# Patient Record
Sex: Female | Born: 1965
Health system: Southern US, Community
[De-identification: ages and names within clinical notes are randomized; demographics above are authoritative.]

## PROBLEM LIST (undated history)

## (undated) HISTORY — PX: ABDOMINAL HYSTERECTOMY: SHX81

## (undated) HISTORY — PX: CHOLECYSTECTOMY: SHX55

## (undated) HISTORY — PX: TONSILLECTOMY: SUR1361

---

## 1998-09-22 ENCOUNTER — Other Ambulatory Visit: Admission: RE | Admit: 1998-09-22 | Discharge: 1998-09-22 | Payer: Self-pay | Admitting: Obstetrics and Gynecology

## 1998-11-28 ENCOUNTER — Encounter: Payer: Self-pay | Admitting: Obstetrics and Gynecology

## 1998-11-28 ENCOUNTER — Ambulatory Visit (HOSPITAL_COMMUNITY): Admission: RE | Admit: 1998-11-28 | Discharge: 1998-11-28 | Payer: Self-pay | Admitting: Obstetrics and Gynecology

## 1998-12-24 ENCOUNTER — Encounter: Payer: Self-pay | Admitting: Obstetrics and Gynecology

## 1998-12-24 ENCOUNTER — Ambulatory Visit (HOSPITAL_COMMUNITY): Admission: RE | Admit: 1998-12-24 | Discharge: 1998-12-24 | Payer: Self-pay | Admitting: Obstetrics and Gynecology

## 1999-02-09 ENCOUNTER — Ambulatory Visit (HOSPITAL_COMMUNITY): Admission: RE | Admit: 1999-02-09 | Discharge: 1999-02-09 | Payer: Self-pay | Admitting: Obstetrics and Gynecology

## 2000-07-21 ENCOUNTER — Other Ambulatory Visit: Admission: RE | Admit: 2000-07-21 | Discharge: 2000-07-21 | Payer: Self-pay | Admitting: Dermatology

## 2002-08-16 ENCOUNTER — Other Ambulatory Visit: Admission: RE | Admit: 2002-08-16 | Discharge: 2002-08-16 | Payer: Self-pay | Admitting: Obstetrics and Gynecology

## 2003-11-01 ENCOUNTER — Ambulatory Visit (HOSPITAL_COMMUNITY): Admission: RE | Admit: 2003-11-01 | Discharge: 2003-11-01 | Payer: Self-pay | Admitting: Family Medicine

## 2003-11-04 ENCOUNTER — Ambulatory Visit (HOSPITAL_COMMUNITY): Admission: RE | Admit: 2003-11-04 | Discharge: 2003-11-04 | Payer: Self-pay | Admitting: Family Medicine

## 2003-12-03 ENCOUNTER — Ambulatory Visit (HOSPITAL_COMMUNITY): Admission: RE | Admit: 2003-12-03 | Discharge: 2003-12-03 | Payer: Self-pay | Admitting: Family Medicine

## 2004-01-06 ENCOUNTER — Ambulatory Visit (HOSPITAL_COMMUNITY): Admission: RE | Admit: 2004-01-06 | Discharge: 2004-01-06 | Payer: Self-pay | Admitting: *Deleted

## 2004-02-04 ENCOUNTER — Ambulatory Visit (HOSPITAL_COMMUNITY): Admission: RE | Admit: 2004-02-04 | Discharge: 2004-02-04 | Payer: Self-pay | Admitting: Family Medicine

## 2009-01-13 ENCOUNTER — Encounter (INDEPENDENT_AMBULATORY_CARE_PROVIDER_SITE_OTHER): Payer: Self-pay | Admitting: Obstetrics and Gynecology

## 2009-01-13 ENCOUNTER — Ambulatory Visit (HOSPITAL_COMMUNITY): Admission: RE | Admit: 2009-01-13 | Discharge: 2009-01-14 | Payer: Self-pay | Admitting: Obstetrics and Gynecology

## 2009-08-13 ENCOUNTER — Encounter: Admission: RE | Admit: 2009-08-13 | Discharge: 2009-08-13 | Payer: Self-pay | Admitting: Obstetrics and Gynecology

## 2010-05-05 LAB — CBC
HCT: 21.6 % — ABNORMAL LOW (ref 36.0–46.0)
HCT: 28 % — ABNORMAL LOW (ref 36.0–46.0)
Hemoglobin: 6.8 g/dL — CL (ref 12.0–15.0)
Hemoglobin: 8.6 g/dL — ABNORMAL LOW (ref 12.0–15.0)
MCHC: 30.8 g/dL (ref 30.0–36.0)
MCHC: 31.4 g/dL (ref 30.0–36.0)
MCV: 72.4 fL — ABNORMAL LOW (ref 78.0–100.0)
MCV: 72.9 fL — ABNORMAL LOW (ref 78.0–100.0)
Platelets: 308 K/uL (ref 150–400)
Platelets: 449 K/uL — ABNORMAL HIGH (ref 150–400)
RBC: 2.97 MIL/uL — ABNORMAL LOW (ref 3.87–5.11)
RBC: 3.86 MIL/uL — ABNORMAL LOW (ref 3.87–5.11)
RDW: 17.5 % — ABNORMAL HIGH (ref 11.5–15.5)
RDW: 17.7 % — ABNORMAL HIGH (ref 11.5–15.5)
WBC: 8.9 K/uL (ref 4.0–10.5)
WBC: 9.2 K/uL (ref 4.0–10.5)

## 2010-05-05 LAB — TYPE AND SCREEN
ABO/RH(D): O POS
Antibody Screen: NEGATIVE

## 2010-05-05 LAB — HEMOGLOBIN AND HEMATOCRIT, BLOOD
HCT: 23 % — ABNORMAL LOW (ref 36.0–46.0)
HCT: 24.4 % — ABNORMAL LOW (ref 36.0–46.0)
Hemoglobin: 7.2 g/dL — ABNORMAL LOW (ref 12.0–15.0)
Hemoglobin: 7.6 g/dL — ABNORMAL LOW (ref 12.0–15.0)

## 2010-05-05 LAB — ABO/RH: ABO/RH(D): O POS

## 2010-06-19 NOTE — Procedures (Signed)
Angela Jacobs, Angela Jacobs               ACCOUNT NO.:  [id number]   MEDICAL RECORD NO.:  [id number]          PATIENT TYPE:  OUT   LOCATION:  RAD                           FACILITY:  [facility]   PHYSICIAN:  Dani Gobble, MD       DATE OF BIRTH:  08/02/1965   DATE OF PROCEDURE:  12/03/2003  DATE OF DISCHARGE:                                  ECHOCARDIOGRAM   INDICATIONS:  [name] is a 45 year old female with a history of  shortness of breath, status post bronchitis over the past one to two months.  She has a strong family history heart disease.   STUDY:  The technical quality of the study is adequate although apical views  are somewhat limited secondary to patient's body habitus.   FINDINGS:  Aorta is within normal limits at 2.5 cm.   The left atrium is within normal limits at 2.8 cm.  No obvious clots or  masses were appreciated.  The patient appeared to be in sinus rhythm during  this procedure.   The interventricular septum and posterior wall within normal limits with  thickness of 1.1 cm and 1 cm respectively.   The aortic valve appeared to grossly structurally normal with normal leaflet  excursion.  No significant aortic insufficiency was noted.  Doppler  interrogation of the aortic valve also was within normal limits.   The mitral valve also appeared structurally normal.  There was flat  coaptation of the mitral valve leaflets but no definite mitral valve  prolapse was appreciated.  No significant mitral regurgitation was noted.  Doppler interrogation of the mitral valve was within normal limits.   Pulmonic valve was incompletely visualized.   The tricuspid valve appeared grossly structurally normal with trivial  tricuspid regurgitation noted.   The left ventricle was normal in size and overall left ventricular systolic  function appeared to be low normal.  No regional wall motion abnormalities  were noted.  There was no evidence for diastolic dysfunction.  The right  atrium  appeared borderline dilated to mildly dilated.  The right ventricle  was normal in size with normal right ventricular systolic function.   IMPRESSION:  1.  Flat coaptation of the mitral valve leaflets without definite mitral      valve prolapse appreciated.  2.  Borderline to mildly dilated right atrium.  3.  Normal left ventricular size with low normal left ventricular systolic      function.  No regional wall motion abnormalities were appreciated.  4.  Trivial tricuspid regurgitation.      AB/MEDQ  D:  12/03/2003  T:  12/04/2003  Job:  [id number]

## 2010-08-24 ENCOUNTER — Other Ambulatory Visit: Payer: Self-pay | Admitting: Obstetrics and Gynecology

## 2010-08-24 DIAGNOSIS — Z1231 Encounter for screening mammogram for malignant neoplasm of breast: Secondary | ICD-10-CM

## 2010-08-31 ENCOUNTER — Ambulatory Visit
Admission: RE | Admit: 2010-08-31 | Discharge: 2010-08-31 | Disposition: A | Discharge status: Home / Self Care | Payer: BC Managed Care – PPO | Source: Ambulatory Visit | Attending: Obstetrics and Gynecology | Admitting: Obstetrics and Gynecology

## 2010-08-31 DIAGNOSIS — Z1231 Encounter for screening mammogram for malignant neoplasm of breast: Secondary | ICD-10-CM

## 2013-05-02 ENCOUNTER — Other Ambulatory Visit (HOSPITAL_COMMUNITY): Payer: Self-pay | Admitting: Family Medicine

## 2013-05-02 ENCOUNTER — Ambulatory Visit (HOSPITAL_COMMUNITY)
Admission: RE | Admit: 2013-05-02 | Discharge: 2013-05-02 | Disposition: A | Discharge status: Home / Self Care | Payer: BC Managed Care – PPO | Source: Ambulatory Visit | Attending: Family Medicine | Admitting: Family Medicine

## 2013-05-02 DIAGNOSIS — M25519 Pain in unspecified shoulder: Secondary | ICD-10-CM

## 2013-07-05 ENCOUNTER — Ambulatory Visit (INDEPENDENT_AMBULATORY_CARE_PROVIDER_SITE_OTHER): Payer: BC Managed Care – PPO | Admitting: Otolaryngology

## 2013-07-05 DIAGNOSIS — H6980 Other specified disorders of Eustachian tube, unspecified ear: Secondary | ICD-10-CM

## 2013-07-05 DIAGNOSIS — J310 Chronic rhinitis: Secondary | ICD-10-CM

## 2015-05-27 ENCOUNTER — Emergency Department (HOSPITAL_COMMUNITY): Payer: 59

## 2015-05-27 ENCOUNTER — Encounter (HOSPITAL_COMMUNITY): Payer: Self-pay | Admitting: Emergency Medicine

## 2015-05-27 ENCOUNTER — Emergency Department (HOSPITAL_COMMUNITY)
Admission: EM | Admit: 2015-05-27 | Discharge: 2015-05-27 | Disposition: A | Discharge status: Home / Self Care | Payer: 59 | Attending: Emergency Medicine | Admitting: Emergency Medicine

## 2015-05-27 DIAGNOSIS — R200 Anesthesia of skin: Secondary | ICD-10-CM | POA: Diagnosis not present

## 2015-05-27 DIAGNOSIS — R1084 Generalized abdominal pain: Secondary | ICD-10-CM | POA: Diagnosis not present

## 2015-05-27 DIAGNOSIS — R110 Nausea: Secondary | ICD-10-CM | POA: Diagnosis not present

## 2015-05-27 DIAGNOSIS — Z9889 Other specified postprocedural states: Secondary | ICD-10-CM | POA: Diagnosis not present

## 2015-05-27 DIAGNOSIS — K529 Noninfective gastroenteritis and colitis, unspecified: Secondary | ICD-10-CM

## 2015-05-27 LAB — CBC WITH DIFFERENTIAL/PLATELET
BASOS PCT: 0 %
Basophils Absolute: 0 10*3/uL (ref 0.0–0.1)
EOS ABS: 0 10*3/uL (ref 0.0–0.7)
Eosinophils Relative: 0 %
HCT: 46.9 % — ABNORMAL HIGH (ref 36.0–46.0)
Hemoglobin: 15.8 g/dL — ABNORMAL HIGH (ref 12.0–15.0)
LYMPHS PCT: 11 %
Lymphs Abs: 2.1 10*3/uL (ref 0.7–4.0)
MCH: 28.8 pg (ref 26.0–34.0)
MCHC: 33.7 g/dL (ref 30.0–36.0)
MCV: 85.6 fL (ref 78.0–100.0)
MONO ABS: 0.8 10*3/uL (ref 0.1–1.0)
Monocytes Relative: 4 %
NEUTROS ABS: 15.9 10*3/uL — AB (ref 1.7–7.7)
Neutrophils Relative %: 85 %
PLATELETS: 271 10*3/uL (ref 150–400)
RBC: 5.48 MIL/uL — ABNORMAL HIGH (ref 3.87–5.11)
RDW: 13.2 % (ref 11.5–15.5)
WBC: 18.8 10*3/uL — ABNORMAL HIGH (ref 4.0–10.5)

## 2015-05-27 LAB — COMPREHENSIVE METABOLIC PANEL
ALT: 32 U/L (ref 14–54)
AST: 35 U/L (ref 15–41)
Albumin: 4.1 g/dL (ref 3.5–5.0)
Alkaline Phosphatase: 99 U/L (ref 38–126)
Anion gap: 11 (ref 5–15)
BUN: 15 mg/dL (ref 6–20)
CHLORIDE: 107 mmol/L (ref 101–111)
CO2: 22 mmol/L (ref 22–32)
Calcium: 9.2 mg/dL (ref 8.9–10.3)
Creatinine, Ser: 0.71 mg/dL (ref 0.44–1.00)
GFR calc Af Amer: >60 mL/min (ref >60)
Glucose, Bld: 121 mg/dL — ABNORMAL HIGH (ref 65–99)
POTASSIUM: 3.7 mmol/L (ref 3.5–5.1)
SODIUM: 140 mmol/L (ref 135–145)
Total Bilirubin: 0.5 mg/dL (ref 0.3–1.2)
Total Protein: 7.7 g/dL (ref 6.5–8.1)

## 2015-05-27 LAB — LIPASE, BLOOD: LIPASE: 23 U/L (ref 11–51)

## 2015-05-27 MED ORDER — LORAZEPAM 2 MG/ML IJ SOLN
1.0000 mg | Freq: Once | INTRAMUSCULAR | Status: AC
  Administered 2015-05-27: 1 mg via INTRAVENOUS
  Filled 2015-05-27: qty 1

## 2015-05-27 MED ORDER — IOPAMIDOL (ISOVUE-300) INJECTION 61%
100.0000 mL | Freq: Once | INTRAVENOUS | Status: AC | PRN
  Administered 2015-05-27: 100 mL via INTRAVENOUS

## 2015-05-27 MED ORDER — DIATRIZOATE MEGLUMINE & SODIUM 66-10 % PO SOLN
ORAL | Status: AC
  Filled 2015-05-27: qty 30

## 2015-05-27 MED ORDER — ONDANSETRON 4 MG PO TBDP: ORAL_TABLET | ORAL | Status: AC

## 2015-05-27 MED ORDER — DICYCLOMINE HCL 20 MG PO TABS: ORAL_TABLET | ORAL | Status: AC

## 2015-05-27 NOTE — ED Notes (Addendum)
History of IBS.  While at work, "I had a sick feeling".  Bowels moved twice at work.  Got hot and sweaty and both arms felt numb.  C/o felt like she was spinning and when she sit up she got dizzy.  Glucose with EMS 135.  Given zofran 4 mg IV and 250 ml NS bolus.

## 2015-05-27 NOTE — ED Provider Notes (Signed)
CSN: [id number]     Arrival date & time 05/27/15  1233 History  By signing my name below, I, Eustaquio Maize, attest that this documentation has been prepared under the direction and in the presence of Milton Ferguson, MD. Electronically Signed: Eustaquio Maize, ED Scribe. 05/27/2015. 12:42 PM.   Chief Complaint  Patient presents with  . Abdominal Pain   Patient is a 50 y.o. female presenting with abdominal pain. The history is provided by the patient. No language interpreter was used.  Abdominal Pain Pain location:  Generalized Pain radiates to:  Does not radiate Pain severity:  Moderate Onset quality:  Sudden Duration:  1 hour Timing:  Constant Progression:  Unchanged Chronicity:  New Relieved by:  None tried Worsened by:  Nothing tried Ineffective treatments:  None tried Associated symptoms: nausea   Associated symptoms: no chest pain, no cough, no diarrhea, no fatigue and no hematuria      HPI Comments: Angela Jacobs is a 50 y.o. female with PMHx IBS, who presents to the Emergency Department complaining of sudden onset, constant, diffuse abdominal pain that occurred earlier today. Pt reports that she also felt nauseated and had bilateral arm numbness with the abdominal pain. Pt has 2 BMs without relief from the pain, prompting her to call EMS. She was given 4 mg Zofran en route. Pt has never had symptoms like this with her IBS in the past. Denies any other associated symptoms. PSHx cholecystectomy.   History reviewed. No pertinent past medical history. Past Surgical History  Procedure Laterality Date  . Cholecystectomy    . Tonsillectomy    . Abdominal hysterectomy    . Cesarean section     Family History  Problem Relation Age of Onset  . Heart failure Mother   . Heart failure Father   . Hypertension Father    Social History  Substance Use Topics  . Smoking status: Never Smoker   . Smokeless tobacco: None  . Alcohol Use: No   OB History    No data available      Review of Systems  Constitutional: Negative for appetite change and fatigue.  HENT: Negative for congestion, ear discharge and sinus pressure.   Eyes: Negative for discharge.  Respiratory: Negative for cough.   Cardiovascular: Negative for chest pain.  Gastrointestinal: Positive for nausea and abdominal pain. Negative for diarrhea.  Genitourinary: Negative for frequency and hematuria.  Musculoskeletal: Negative for back pain.  Skin: Negative for rash.  Neurological: Positive for numbness. Negative for seizures and headaches.  Psychiatric/Behavioral: Negative for hallucinations.   Allergies  Amoxicillin and Erythromycin  Home Medications   Prior to Admission medications   Not on File   Ht 5\' 7"  (1.702 m)  Wt 195 lb (88.451 kg)  BMI 30.53 kg/m2   Physical Exam  Constitutional: She is oriented to person, place, and time. She appears well-developed.  Moderately anxious  HENT:  Head: Normocephalic.  Eyes: Conjunctivae and EOM are normal. No scleral icterus.  Neck: Neck supple. No thyromegaly present.  Cardiovascular: Normal rate and regular rhythm.  Exam reveals no gallop and no friction rub.   No murmur heard. Pulmonary/Chest: No stridor. She has no wheezes. She has no rales. She exhibits no tenderness.  Abdominal: She exhibits no distension. There is no tenderness. There is no rebound.  Musculoskeletal: Normal range of motion. She exhibits no edema.  Lymphadenopathy:    She has no cervical adenopathy.  Neurological: She is oriented to person, place, and time. She exhibits normal  muscle tone. Coordination normal.  Skin: No rash noted. No erythema.  Psychiatric: She has a normal mood and affect. Her behavior is normal.    ED Course  Procedures (including critical care time)  DIAGNOSTIC STUDIES:   COORDINATION OF CARE: 12:40 PM-Discussed treatment plan with pt at bedside and pt agreed to plan.   Labs Review Labs Reviewed - No data to display  Imaging Review No  results found. I have personally reviewed and evaluated these images and lab results as part of my medical decision-making.   EKG Interpretation None      MDM   Final diagnoses:  None    CT scan abdomen unremarkable. Labs show elevated WBCs. Suspect enteritis patient given prescription for Zofran and Bentyl will follow-up with her PCP   The chart was scribed for me under my direct supervision.  I personally performed the history, physical, and medical decision making and all procedures in the evaluation of this patient.Milton Ferguson, MD 05/27/15 [phone]

## 2015-05-27 NOTE — Discharge Instructions (Signed)
Drink plenty of fluids and follow-up with your family doctor in 2-3 days

## 2015-05-27 NOTE — ED Notes (Signed)
Patient states she needs restroom. Ambulatory to restroom. Instructed to give urine specimen.

## 2017-02-16 DIAGNOSIS — R350 Frequency of micturition: Secondary | ICD-10-CM | POA: Diagnosis not present

## 2017-02-16 DIAGNOSIS — R319 Hematuria, unspecified: Secondary | ICD-10-CM | POA: Diagnosis not present

## 2017-02-16 DIAGNOSIS — N342 Other urethritis: Secondary | ICD-10-CM | POA: Diagnosis not present

## 2017-03-16 DIAGNOSIS — E6609 Other obesity due to excess calories: Secondary | ICD-10-CM | POA: Diagnosis not present

## 2017-03-16 DIAGNOSIS — R6889 Other general symptoms and signs: Secondary | ICD-10-CM | POA: Diagnosis not present

## 2017-03-16 DIAGNOSIS — B349 Viral infection, unspecified: Secondary | ICD-10-CM | POA: Diagnosis not present

## 2017-03-16 DIAGNOSIS — Z6831 Body mass index (BMI) 31.0-31.9, adult: Secondary | ICD-10-CM | POA: Diagnosis not present

## 2017-09-21 DIAGNOSIS — Z1389 Encounter for screening for other disorder: Secondary | ICD-10-CM | POA: Diagnosis not present

## 2017-09-21 DIAGNOSIS — E663 Overweight: Secondary | ICD-10-CM | POA: Diagnosis not present

## 2017-09-21 DIAGNOSIS — R350 Frequency of micturition: Secondary | ICD-10-CM | POA: Diagnosis not present

## 2017-09-21 DIAGNOSIS — M545 Low back pain: Secondary | ICD-10-CM | POA: Diagnosis not present

## 2018-03-16 DIAGNOSIS — J019 Acute sinusitis, unspecified: Secondary | ICD-10-CM | POA: Diagnosis not present

## 2018-03-16 DIAGNOSIS — J309 Allergic rhinitis, unspecified: Secondary | ICD-10-CM | POA: Diagnosis not present

## 2018-03-16 DIAGNOSIS — Z6830 Body mass index (BMI) 30.0-30.9, adult: Secondary | ICD-10-CM | POA: Diagnosis not present

## 2018-03-30 DIAGNOSIS — L821 Other seborrheic keratosis: Secondary | ICD-10-CM | POA: Diagnosis not present

## 2018-03-30 DIAGNOSIS — L918 Other hypertrophic disorders of the skin: Secondary | ICD-10-CM | POA: Diagnosis not present

## 2018-03-30 DIAGNOSIS — D229 Melanocytic nevi, unspecified: Secondary | ICD-10-CM | POA: Diagnosis not present

## 2019-05-11 ENCOUNTER — Other Ambulatory Visit (HOSPITAL_COMMUNITY): Payer: Self-pay | Admitting: Physician Assistant

## 2019-05-11 DIAGNOSIS — Z1231 Encounter for screening mammogram for malignant neoplasm of breast: Secondary | ICD-10-CM

## 2019-07-09 ENCOUNTER — Other Ambulatory Visit: Payer: Self-pay

## 2019-07-09 ENCOUNTER — Ambulatory Visit (HOSPITAL_COMMUNITY)
Admission: RE | Admit: 2019-07-09 | Discharge: 2019-07-09 | Disposition: A | Discharge status: Home / Self Care | Payer: 59 | Source: Ambulatory Visit | Attending: Physician Assistant | Admitting: Physician Assistant

## 2019-07-09 DIAGNOSIS — Z1231 Encounter for screening mammogram for malignant neoplasm of breast: Secondary | ICD-10-CM | POA: Diagnosis present

## 2020-06-22 ENCOUNTER — Ambulatory Visit
Admission: EM | Admit: 2020-06-22 | Discharge: 2020-06-22 | Disposition: A | Discharge status: Home / Self Care | Payer: 59 | Attending: Family Medicine | Admitting: Family Medicine

## 2020-06-22 ENCOUNTER — Encounter: Payer: Self-pay | Admitting: Emergency Medicine

## 2020-06-22 ENCOUNTER — Other Ambulatory Visit: Payer: Self-pay

## 2020-06-22 ENCOUNTER — Ambulatory Visit (INDEPENDENT_AMBULATORY_CARE_PROVIDER_SITE_OTHER): Payer: 59

## 2020-06-22 DIAGNOSIS — R509 Fever, unspecified: Secondary | ICD-10-CM

## 2020-06-22 DIAGNOSIS — R059 Cough, unspecified: Secondary | ICD-10-CM | POA: Diagnosis not present

## 2020-06-22 DIAGNOSIS — B9789 Other viral agents as the cause of diseases classified elsewhere: Secondary | ICD-10-CM

## 2020-06-22 MED ORDER — ACETAMINOPHEN 325 MG PO TABS
650.0000 mg | ORAL_TABLET | Freq: Once | ORAL | Status: AC
  Administered 2020-06-22: 650 mg via ORAL

## 2020-06-22 MED ORDER — PROMETHAZINE-DM 6.25-15 MG/5ML PO SYRP: 5.0000 mL | ORAL_SOLUTION | Freq: Four times a day (QID) | ORAL | 0 refills | Status: AC | PRN

## 2020-06-22 MED ORDER — IPRATROPIUM BROMIDE 0.03 % NA SOLN: 2.0000 | Freq: Three times a day (TID) | NASAL | 0 refills | Status: AC | PRN

## 2020-06-22 NOTE — ED Provider Notes (Signed)
[facility]    CSN: [id number] Arrival date & time: 06/22/20  1231      History   Chief Complaint Chief Complaint  Patient presents with  . Fever  . Headache    HPI Angela Jacobs is a 54 y.o. female.   HPI  Patient presents with URI symptoms including cough, sore throat, otalgia, nasal congestion, fever,  and sinus pressure. Unknown of COVID exposure. COVID Vaccinated: Y and had COVID back in December.  She denies any shortness of breath.  Symptoms started abruptly yesterday evening.  She took a home COVID test which was negative.  She has 102.2 fever on arrival.  No history of asthma or any underlying respiratory disease   History reviewed. No pertinent past medical history.  There are no problems to display for this patient.   Past Surgical History:  Procedure Laterality Date  . ABDOMINAL HYSTERECTOMY    . CESAREAN SECTION    . CHOLECYSTECTOMY    . TONSILLECTOMY      OB History   No obstetric history on file.      Home Medications    Prior to Admission medications   Medication Sig Start Date End Date Taking? Authorizing Provider  dicyclomine (BENTYL) 20 MG tablet Take one every 6 hours for abdominal cramps 05/27/15   Milton Ferguson, MD  ondansetron (ZOFRAN ODT) 4 MG disintegrating tablet 4mg  ODT q4 hours prn nausea/vomit 05/27/15   Milton Ferguson, MD    Family History Family History  Problem Relation Age of Onset  . Heart failure Mother   . Heart failure Father   . Hypertension Father     Social History Social History   Tobacco Use  . Smoking status: Never Smoker  . Smokeless tobacco: Never Used  Substance Use Topics  . Alcohol use: No  . Drug use: No     Allergies   Amoxicillin and Erythromycin   Review of Systems Review of Systems Pertinent negatives listed in HPI  Physical Exam Triage Vital Signs ED Triage Vitals  Enc Vitals Group     BP 06/22/20 1445 115/75     Pulse Rate 06/22/20 1445 94     Resp 06/22/20 1445 18      Temp 06/22/20 1445 (!) 102.2 F (39 C)     Temp Source 06/22/20 1445 Oral     SpO2 06/22/20 1445 94 %     Weight 06/22/20 1447 175 lb (79.4 kg)     Height --      Head Circumference --      Peak Flow --      Pain Score 06/22/20 1445 0     Pain Loc --      Pain Edu? --      Excl. in [place]? --    No data found.  Updated Vital Signs BP 115/75 (BP Location: Right Arm)   Pulse 94   Temp (!) 102.2 F (39 C) (Oral)   Resp 18   Wt 175 lb (79.4 kg)   SpO2 94%   BMI 27.41 kg/m   Visual Acuity Right Eye Distance:   Left Eye Distance:   Bilateral Distance:    Right Eye Near:   Left Eye Near:    Bilateral Near:     Physical Exam  General Appearance:    Alert, acutely ill-appearing., cooperative, no distress  HENT:   Normocephalic, ears normal, nares mucosal edema with congestion, rhinorrhea, oropharynx    Eyes:    PERRL, conjunctiva/corneas clear, EOM's  intact       Lungs:     Clear to auscultation bilaterally, respirations unlabored  Heart:    Regular rate and rhythm  Neurologic:   Awake, alert, oriented x 3. No apparent focal neurological           defect.     UC Treatments / Results  Labs (all labs ordered are listed, but only abnormal results are displayed) Labs Reviewed  COVID-19, FLU A+B NAA    EKG   Radiology DG Chest 2 View  Result Date: 06/22/2020 CLINICAL DATA:  Fever, cough EXAM: CHEST - 2 VIEW COMPARISON:  11/01/2003 FINDINGS: Heart and mediastinal contours are within normal limits. No focal opacities or effusions. No acute bony abnormality. IMPRESSION: No active cardiopulmonary disease. Electronically Signed   By: Rolm Baptise M.D.   On: 06/22/2020 15:02    Procedures Procedures (including critical care time)  Medications Ordered in UC Medications  acetaminophen (TYLENOL) tablet 650 mg (650 mg Oral Given 06/22/20 1448)  acetaminophen (TYLENOL) tablet 650 mg (650 mg Oral Given 06/22/20 1449)    Initial Impression / Assessment and Plan / UC Course   I have reviewed the triage vital signs and the nursing notes.  Pertinent labs & imaging results that were available during my care of the patient were reviewed by me and considered in my medical decision making (see chart for details).    COVID/Flu test pending.Symptom management warranted only.  Manage fever with Tylenol and ibuprofen.  Nasal symptoms with over-the-counter antihistamines recommended.  Treatment per discharge medications/discharge instructions.  Red flags/ER precautions given. The most current CDC isolation/quarantine recommendation advised.  Patient advised if positive for influenza she can request.  Tamiflu given the acute onset of her symptoms she will still be within 72 hours guidelines with her test results are available Final Clinical Impressions(s) / UC Diagnoses   Final diagnoses:  Fever, unspecified  Viral respiratory illness   Discharge Instructions   None    ED Prescriptions    Medication Sig Dispense Auth. Provider   promethazine-dextromethorphan (PROMETHAZINE-DM) 6.25-15 MG/5ML syrup Take 5 mLs by mouth 4 (four) times daily as needed for cough. 180 mL Scot Jun, FNP   ipratropium (ATROVENT) 0.03 % nasal spray Place 2 sprays into both nostrils 3 (three) times daily as needed for rhinitis. 30 mL Scot Jun, FNP     PDMP not reviewed this encounter.   Scot Jun, FNP 06/22/20 [phone]

## 2020-06-22 NOTE — ED Triage Notes (Signed)
For about a week has had a cough but yesterday got a fever and headache and chills.  Took a rapid covid test last night and it was negative.  Has had covid vaccines and covid in december.

## 2020-06-22 NOTE — Discharge Instructions (Addendum)
Your COVID 19 results should result within 3 days. °Negative results are immediately resulted to Mychart. Positive results will receive a follow-up call from our clinic. If symptoms are present, I recommend home quarantine until results are known.  °Alternate Tylenol and ibuprofen as needed for body aches and fever.  Symptom management per recommendations discussed today.  If any breathing difficulty or chest pain develops go immediately to the closest emergency department for evaluation.  ° °

## 2020-06-24 ENCOUNTER — Telehealth: Payer: Self-pay | Admitting: Emergency Medicine

## 2020-06-24 LAB — COVID-19, FLU A+B NAA
Influenza A, NAA: DETECTED — AB
Influenza B, NAA: NOT DETECTED
SARS-CoV-2, NAA: NOT DETECTED

## 2020-06-24 MED ORDER — OSELTAMIVIR PHOSPHATE 75 MG PO CAPS: 75.0000 mg | ORAL_CAPSULE | Freq: Two times a day (BID) | ORAL | 0 refills | Status: AC

## 2020-07-22 ENCOUNTER — Other Ambulatory Visit (HOSPITAL_COMMUNITY): Payer: Self-pay | Admitting: Physician Assistant

## 2020-07-22 DIAGNOSIS — Z1231 Encounter for screening mammogram for malignant neoplasm of breast: Secondary | ICD-10-CM

## 2020-07-30 ENCOUNTER — Ambulatory Visit (HOSPITAL_COMMUNITY)
Admission: RE | Admit: 2020-07-30 | Discharge: 2020-07-30 | Disposition: A | Discharge status: Home / Self Care | Payer: 59 | Source: Ambulatory Visit | Attending: Physician Assistant | Admitting: Physician Assistant

## 2020-07-30 ENCOUNTER — Other Ambulatory Visit: Payer: Self-pay

## 2020-07-30 DIAGNOSIS — Z1231 Encounter for screening mammogram for malignant neoplasm of breast: Secondary | ICD-10-CM | POA: Insufficient documentation

## 2021-07-09 ENCOUNTER — Other Ambulatory Visit (HOSPITAL_COMMUNITY): Payer: Self-pay | Admitting: Obstetrics and Gynecology

## 2021-07-09 DIAGNOSIS — Z1231 Encounter for screening mammogram for malignant neoplasm of breast: Secondary | ICD-10-CM

## 2021-08-03 ENCOUNTER — Ambulatory Visit (HOSPITAL_COMMUNITY)
Admission: RE | Admit: 2021-08-03 | Discharge: 2021-08-03 | Disposition: A | Discharge status: Home / Self Care | Payer: 59 | Source: Ambulatory Visit | Attending: Obstetrics and Gynecology | Admitting: Obstetrics and Gynecology

## 2021-08-03 DIAGNOSIS — Z1231 Encounter for screening mammogram for malignant neoplasm of breast: Secondary | ICD-10-CM | POA: Diagnosis present

## 2022-07-05 ENCOUNTER — Other Ambulatory Visit (HOSPITAL_COMMUNITY): Payer: Self-pay | Admitting: Family Medicine

## 2022-07-05 DIAGNOSIS — Z1231 Encounter for screening mammogram for malignant neoplasm of breast: Secondary | ICD-10-CM

## 2022-08-09 ENCOUNTER — Ambulatory Visit (HOSPITAL_COMMUNITY)
Admission: RE | Admit: 2022-08-09 | Discharge: 2022-08-09 | Disposition: A | Discharge status: Home / Self Care | Payer: BLUE CROSS/BLUE SHIELD | Source: Ambulatory Visit | Attending: Family Medicine | Admitting: Family Medicine

## 2022-08-09 DIAGNOSIS — Z1231 Encounter for screening mammogram for malignant neoplasm of breast: Secondary | ICD-10-CM

## 2022-09-06 IMAGING — MG MM DIGITAL SCREENING BILAT W/ TOMO AND CAD
8 series · 8 of 24 positions shown · non-contrast
Comparison: Previous exam(s).

CLINICAL DATA: Screening.

EXAM:
DIGITAL SCREENING BILATERAL MAMMOGRAM WITH TOMOSYNTHESIS AND CAD
TECHNIQUE: Bilateral screening digital craniocaudal and mediolateral oblique
mammograms were obtained. Bilateral screening digital breast
tomosynthesis was performed. The images were evaluated with
computer-aided detection.

[R CC synth-2D]
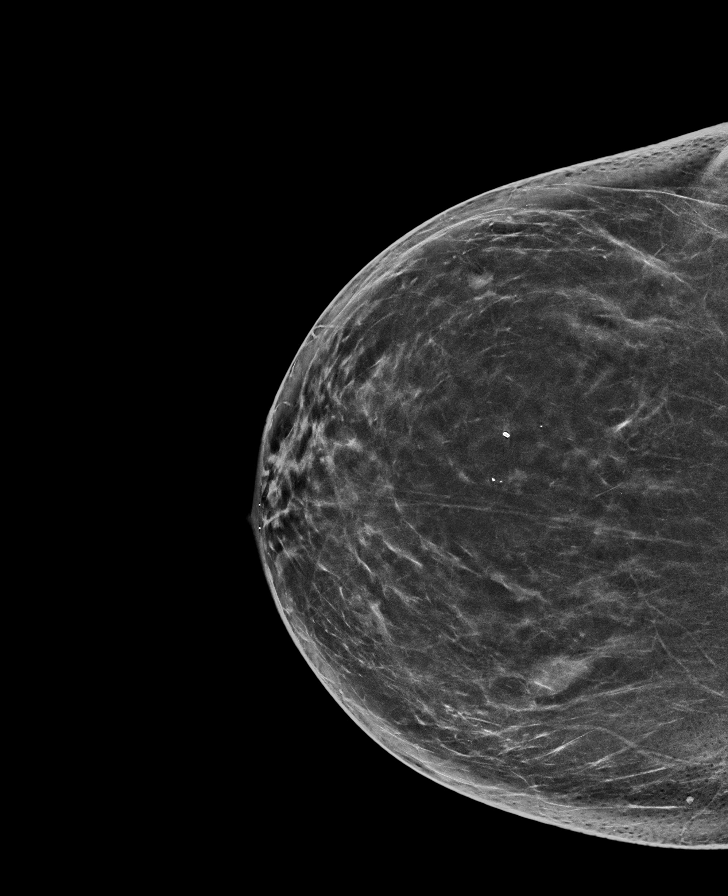

[L CC synth-2D]
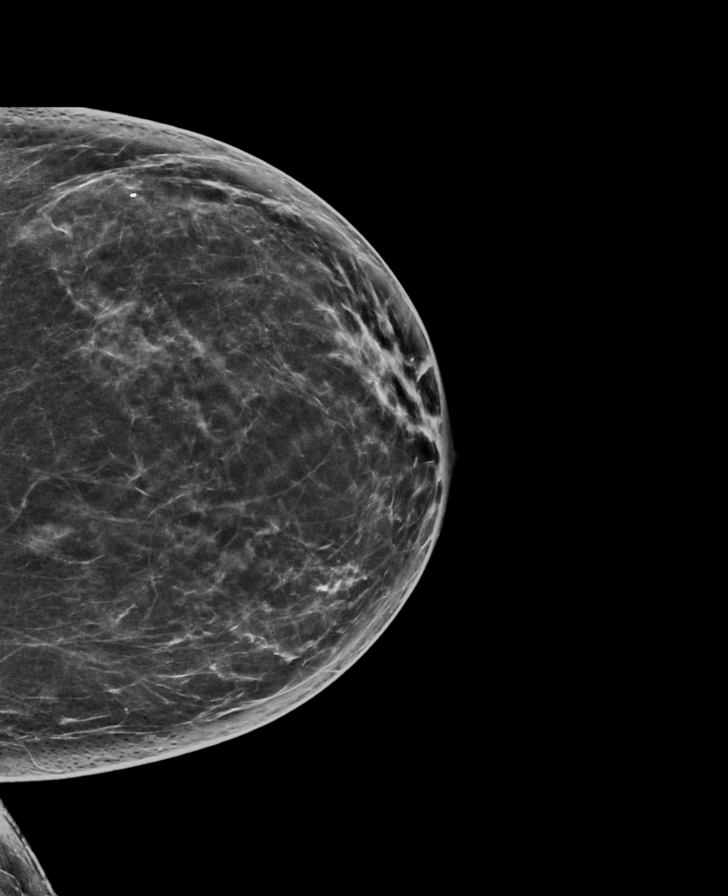

[R MLO synth-2D]
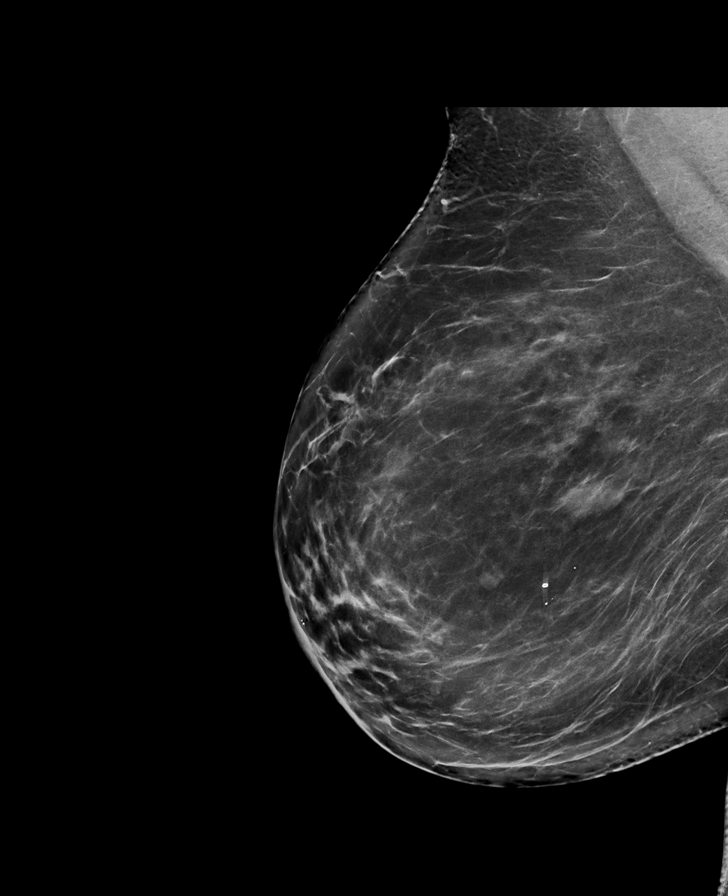

[L MLO synth-2D]
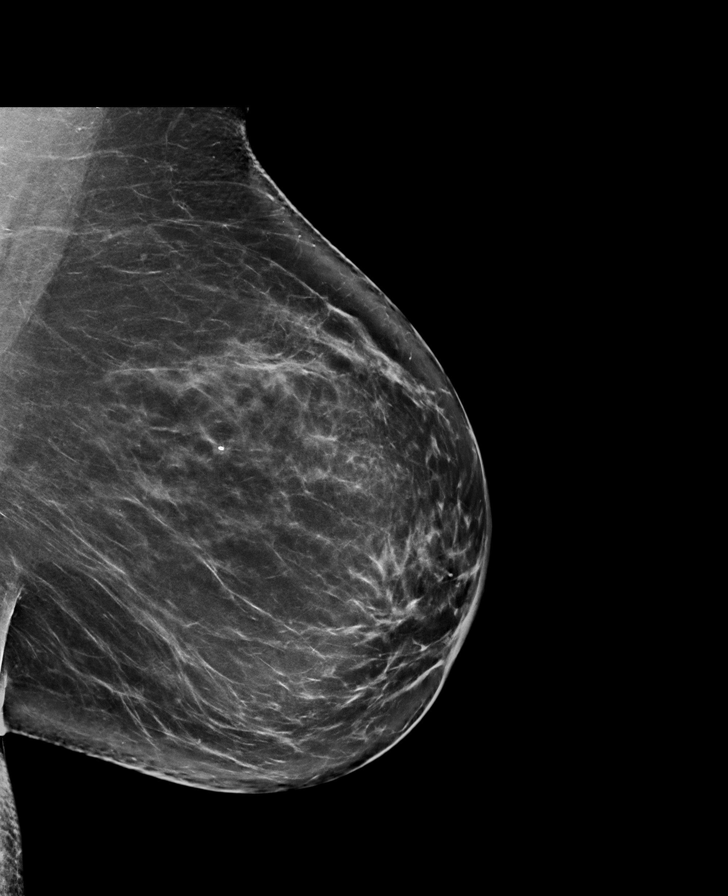

[L MLO tomo · tomo slice 47/92.0]
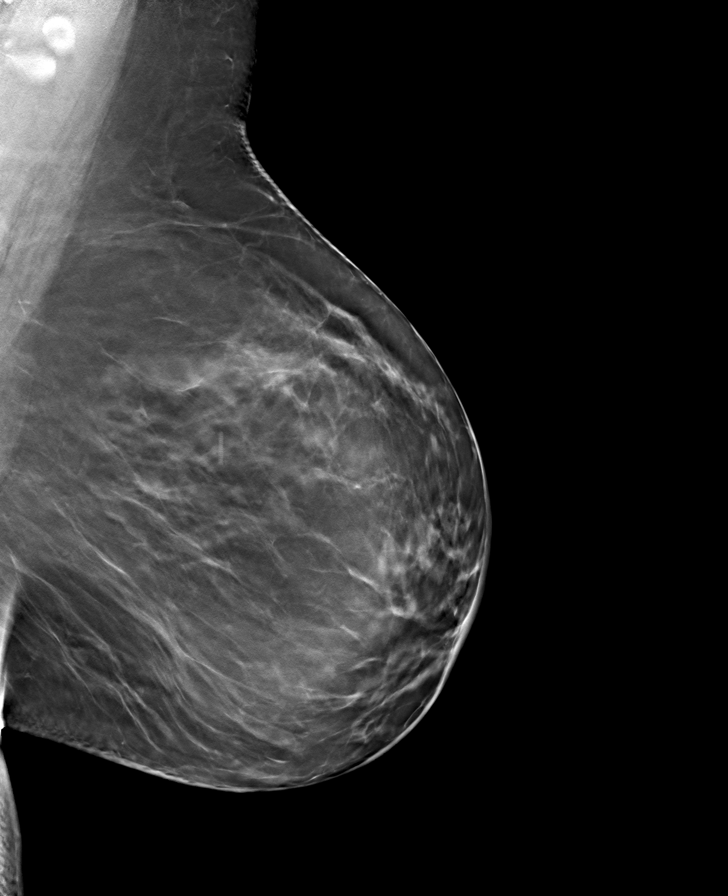

[L CC tomo · tomo slice 39/78.0]
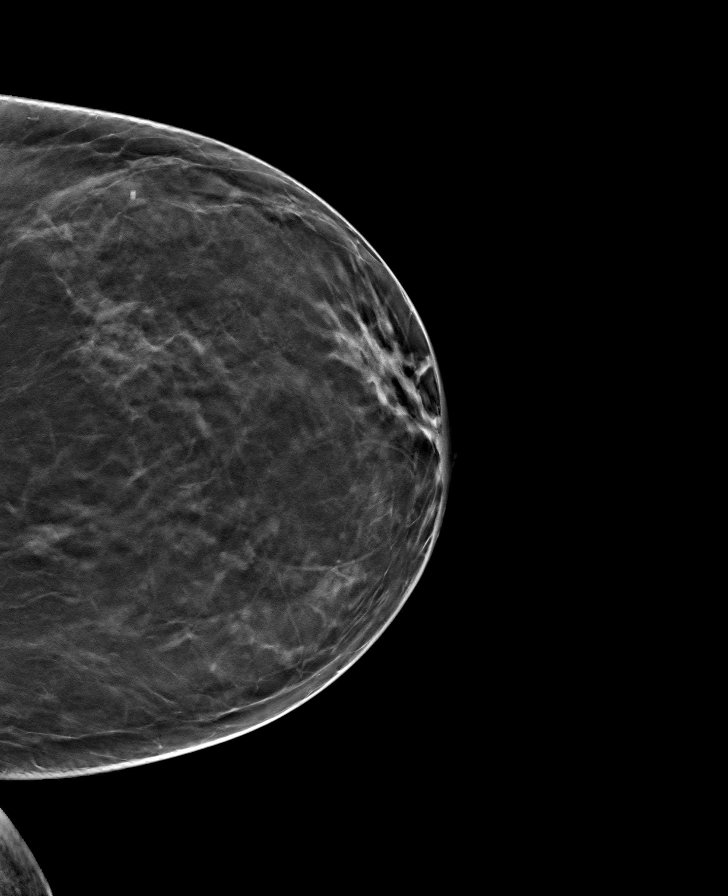

[R MLO tomo · tomo slice 47/93.0]
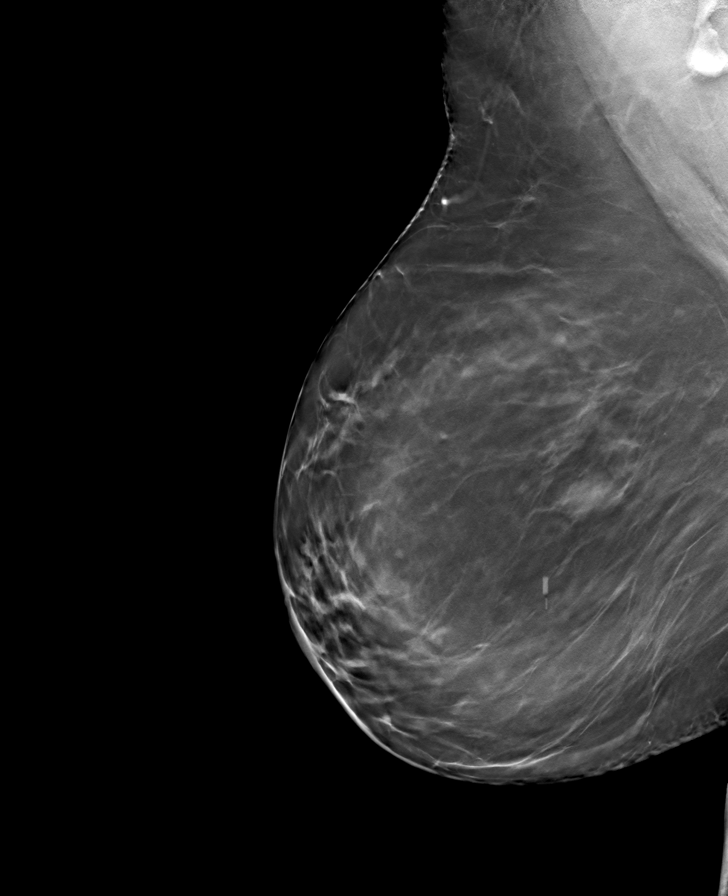

[R CC tomo · tomo slice 41/81.0]
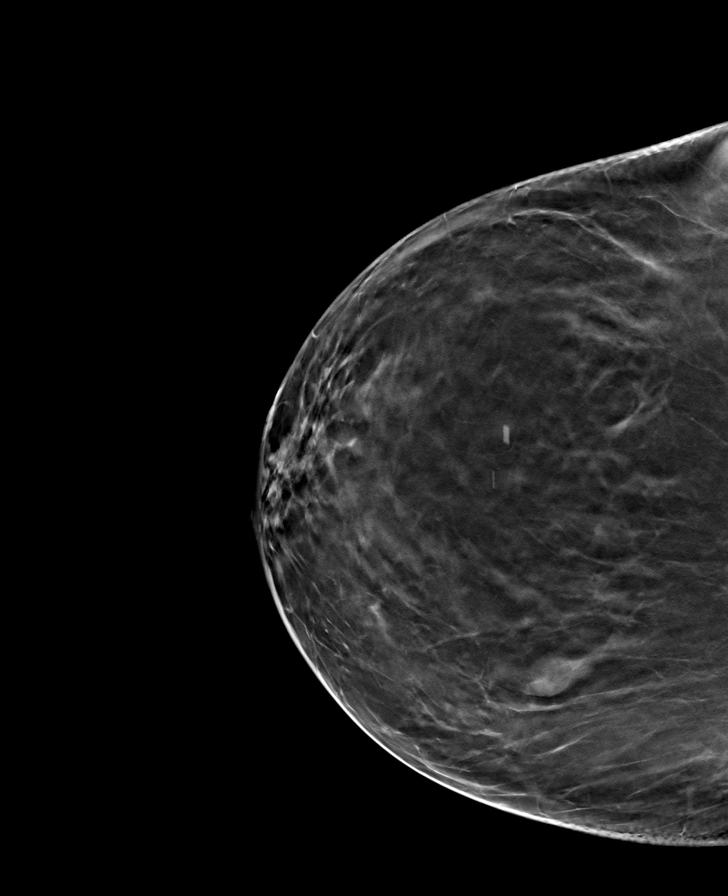

[8 of 24 positions shown; findings below may reference images not displayed]

ACR Breast Density Category b: There are scattered areas of
fibroglandular density.
FINDINGS: There are no findings suspicious for malignancy.
IMPRESSION: No mammographic evidence of malignancy. A result letter of this
screening mammogram will be mailed directly to the patient.

RECOMMENDATION:
Screening mammogram in one year. (Code:51-O-LD2)

BI-RADS CATEGORY  1: Negative.

## 2023-07-05 ENCOUNTER — Encounter (HOSPITAL_COMMUNITY): Payer: Self-pay

## 2023-07-05 ENCOUNTER — Other Ambulatory Visit (HOSPITAL_COMMUNITY): Payer: Self-pay

## 2023-07-05 DIAGNOSIS — Z1231 Encounter for screening mammogram for malignant neoplasm of breast: Secondary | ICD-10-CM

## 2023-08-10 ENCOUNTER — Ambulatory Visit (HOSPITAL_COMMUNITY)

## 2023-08-15 ENCOUNTER — Encounter (HOSPITAL_COMMUNITY): Payer: Self-pay

## 2023-08-15 ENCOUNTER — Ambulatory Visit (HOSPITAL_COMMUNITY)
Admission: RE | Admit: 2023-08-15 | Discharge: 2023-08-15 | Disposition: A | Discharge status: Home / Self Care | Source: Ambulatory Visit | Attending: Family Medicine | Admitting: Family Medicine

## 2023-08-15 DIAGNOSIS — Z1231 Encounter for screening mammogram for malignant neoplasm of breast: Secondary | ICD-10-CM | POA: Insufficient documentation
